# Patient Record
Sex: Male | Born: 1970 | Race: White | Hispanic: No | Marital: Married | Smoking: Never smoker
Health system: Southern US, Community
[De-identification: ages and names within clinical notes are randomized; demographics above are authoritative.]

## PROBLEM LIST (undated history)

## (undated) DIAGNOSIS — R0600 Dyspnea, unspecified: Secondary | ICD-10-CM

## (undated) DIAGNOSIS — F32A Depression, unspecified: Secondary | ICD-10-CM

## (undated) DIAGNOSIS — M2550 Pain in unspecified joint: Secondary | ICD-10-CM

## (undated) DIAGNOSIS — E291 Testicular hypofunction: Secondary | ICD-10-CM

## (undated) HISTORY — DX: Pain in unspecified joint: M25.50

## (undated) HISTORY — DX: Depression, unspecified: F32.A

## (undated) HISTORY — DX: Testicular hypofunction: E29.1

## (undated) HISTORY — DX: Dyspnea, unspecified: R06.00

---

## 2007-02-27 HISTORY — PX: VASECTOMY: SHX75

## 2011-02-27 DIAGNOSIS — B2790 Infectious mononucleosis, unspecified without complication: Secondary | ICD-10-CM

## 2011-02-27 DIAGNOSIS — B029 Zoster without complications: Secondary | ICD-10-CM

## 2011-02-27 HISTORY — DX: Zoster without complications: B02.9

## 2011-02-27 HISTORY — DX: Infectious mononucleosis, unspecified without complication: B27.90

## 2011-07-16 ENCOUNTER — Other Ambulatory Visit (HOSPITAL_COMMUNITY): Payer: Self-pay | Admitting: Endocrinology

## 2011-07-16 ENCOUNTER — Other Ambulatory Visit: Payer: Self-pay | Admitting: Endocrinology

## 2011-07-16 DIAGNOSIS — E236 Other disorders of pituitary gland: Secondary | ICD-10-CM

## 2011-07-16 DIAGNOSIS — E291 Testicular hypofunction: Secondary | ICD-10-CM

## 2011-07-18 ENCOUNTER — Ambulatory Visit (HOSPITAL_COMMUNITY)
Admission: RE | Admit: 2011-07-18 | Discharge: 2011-07-18 | Disposition: A | Discharge status: Home / Self Care | Payer: BC Managed Care – PPO | Source: Ambulatory Visit | Attending: Endocrinology | Admitting: Endocrinology

## 2011-07-18 ENCOUNTER — Other Ambulatory Visit (HOSPITAL_COMMUNITY): Payer: Self-pay | Admitting: Endocrinology

## 2011-07-18 DIAGNOSIS — E291 Testicular hypofunction: Secondary | ICD-10-CM | POA: Insufficient documentation

## 2011-07-18 DIAGNOSIS — R51 Headache: Secondary | ICD-10-CM | POA: Insufficient documentation

## 2011-07-18 DIAGNOSIS — E236 Other disorders of pituitary gland: Secondary | ICD-10-CM

## 2011-07-18 DIAGNOSIS — Z1389 Encounter for screening for other disorder: Secondary | ICD-10-CM | POA: Insufficient documentation

## 2011-07-18 DIAGNOSIS — R5381 Other malaise: Secondary | ICD-10-CM | POA: Insufficient documentation

## 2011-07-18 MED ORDER — GADOBENATE DIMEGLUMINE 529 MG/ML IV SOLN
15.0000 mL | Freq: Once | INTRAVENOUS | Status: AC | PRN
  Administered 2011-07-18: 15 mL via INTRAVENOUS

## 2013-08-11 ENCOUNTER — Other Ambulatory Visit: Payer: Self-pay | Admitting: Neurology

## 2013-08-11 NOTE — Telephone Encounter (Signed)
Patient hasn't been seen since 2013

## 2015-06-15 ENCOUNTER — Other Ambulatory Visit: Payer: Self-pay | Admitting: Endocrinology

## 2015-06-15 DIAGNOSIS — E230 Hypopituitarism: Secondary | ICD-10-CM

## 2015-06-21 ENCOUNTER — Other Ambulatory Visit: Payer: Self-pay | Admitting: Endocrinology

## 2015-06-21 DIAGNOSIS — E230 Hypopituitarism: Secondary | ICD-10-CM

## 2015-06-28 ENCOUNTER — Other Ambulatory Visit: Payer: Self-pay

## 2015-07-12 ENCOUNTER — Ambulatory Visit
Admission: RE | Admit: 2015-07-12 | Discharge: 2015-07-12 | Disposition: A | Discharge status: Home / Self Care | Payer: BLUE CROSS/BLUE SHIELD | Source: Ambulatory Visit | Attending: Endocrinology | Admitting: Endocrinology

## 2015-07-12 DIAGNOSIS — E230 Hypopituitarism: Secondary | ICD-10-CM

## 2015-07-12 MED ORDER — GADOBENATE DIMEGLUMINE 529 MG/ML IV SOLN: 10.0000 mL | Freq: Once | INTRAVENOUS | Status: DC | PRN

## 2016-09-23 IMAGING — MR MR HEAD WO/W CM
19 series · 48 of 48 positions shown · IV contrast (Multihance 10CC)
Comparison: MRI brain [HOSPITAL], 07/18/2011.

CLINICAL DATA: Endocrine disorder. Low testosterone. Family history
of pituitary tumor.

EXAM:
MRI HEAD WITHOUT AND WITH CONTRAST
TECHNIQUE: Multiplanar, multiecho pulse sequences of the brain and surrounding
structures were obtained without and with intravenous contrast.
CONTRAST:  MultiHance 10 mL.

[Series 9: DWI · axial · 3.0mm · 1.56mm/px · z∈[-68,+92]mm · 7 of 84 slices shown (1 of 2)]
[im 1/84]
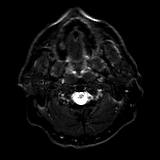
[im 14/84]
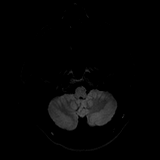
[im 28/84]
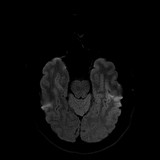
[im 42/84]
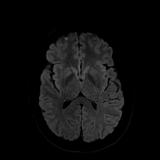
[im 56/84]
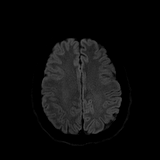
[im 70/84]
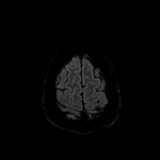
[im 84/84]
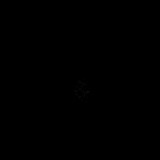

[Series 10: DWI · axial · 3.0mm · 1.56mm/px · z∈[-68,+92]mm · 3 of 41 slices shown (2 of 2)]
[im 1/41]
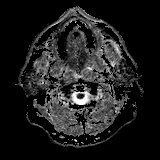
[im 21/41]
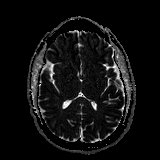
[im 41/41]
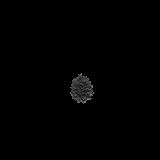

[Series 11: T1 · sagittal · 4.0mm · 0.78mm/px · 2 of 30 slices shown (1 of 5)]
[im 1/30]
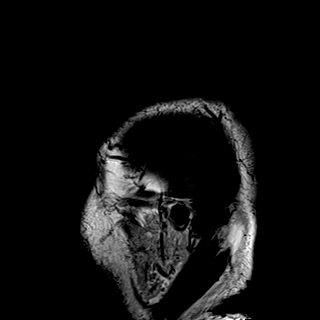
[im 30/30]
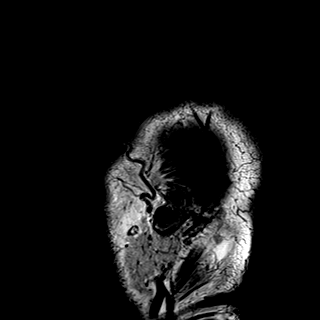

[Series 12: T2 · axial · 4.0mm · 0.39mm/px · z∈[-56,+85]mm · 2 of 28 slices shown]
[im 1/28]
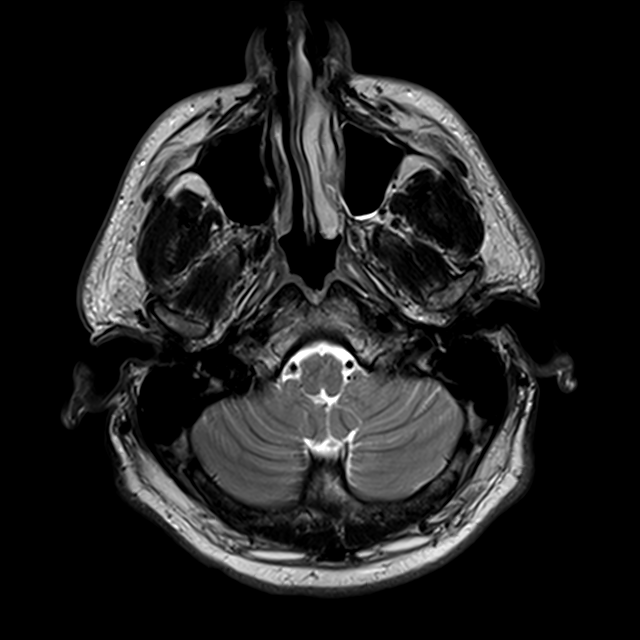
[im 28/28]
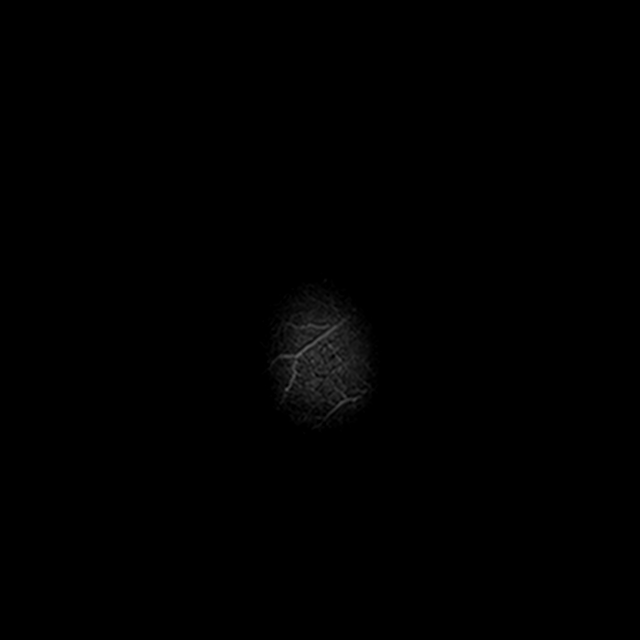

[Series 13: GRE · axial · 4.0mm · 0.78mm/px · z∈[-56,+85]mm · 2 of 28 slices shown]
[im 1/28]
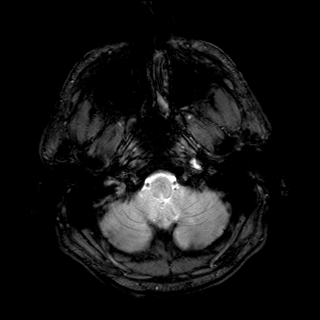
[im 28/28]
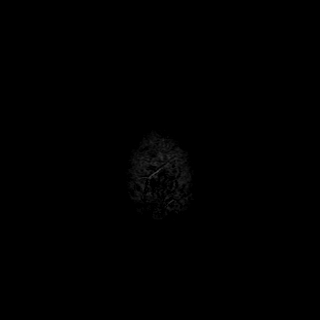

[Series 14: FLAIR · axial · 4.0mm · 0.78mm/px · z∈[-55,+85]mm · 3 of 28 slices shown]
[im 1/28]
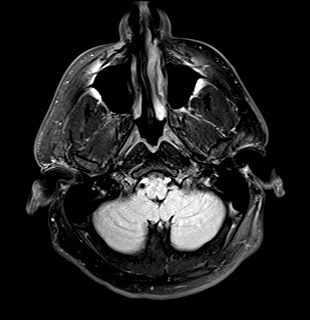
[im 14/28]
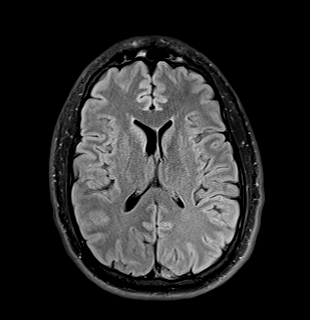
[im 28/28]
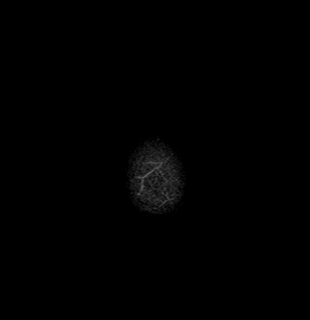

[Series 15: T1 · coronal · 3.0mm · 0.42mm/px · 1 of 10 slices shown (2 of 5)]
[im 1/10]
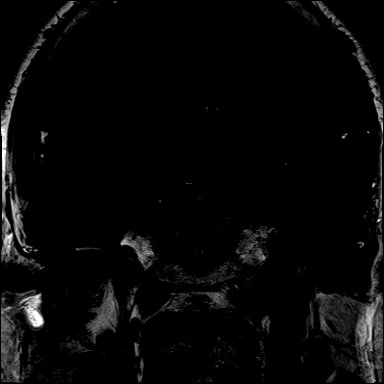

[Series 16: T1 · sagittal · 3.0mm · 0.42mm/px · 1 of 13 slices shown (3 of 5)]
[im 1/13]
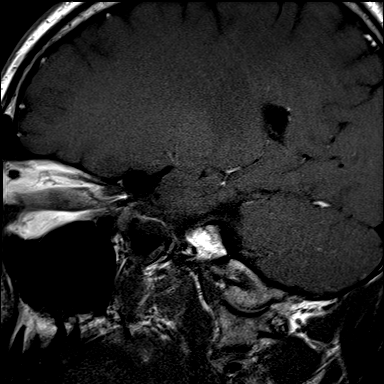

[Series 17: T1 · coronal · non-contrast · 3.0mm · 0.62mm/px · 1 of 7 slices shown (4 of 5)]
[im 1/7]
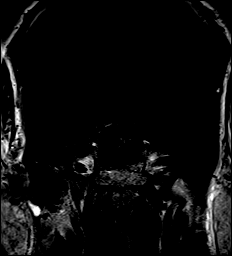

[Series 18: cor post dyn · coronal · 3.0mm · 0.62mm/px · 1 of 7 slices shown (1 of 6)]
[im 1/7]
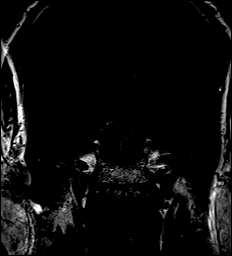

[Series 19: cor post dyn · coronal · 3.0mm · 0.62mm/px · 1 of 7 slices shown (2 of 6)]
[im 1/7]
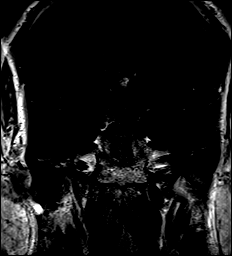

[Series 20: cor post dyn · coronal · 3.0mm · 0.62mm/px · 1 of 7 slices shown (3 of 6)]
[im 1/7]
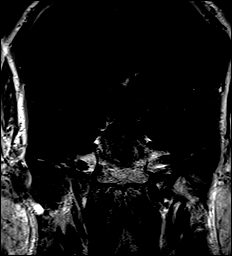

[Series 21: cor post dyn · coronal · 3.0mm · 0.62mm/px · 1 of 7 slices shown (4 of 6)]
[im 1/7]
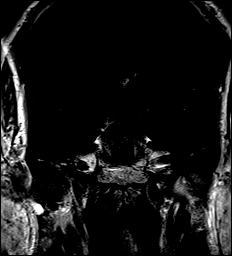

[Series 22: cor post dyn · coronal · 3.0mm · 0.62mm/px · 1 of 7 slices shown (5 of 6)]
[im 1/7]
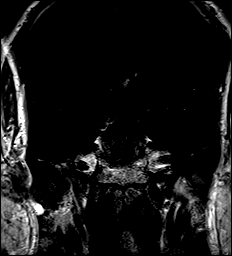

[Series 23: cor post dyn · coronal · 3.0mm · 0.62mm/px · 1 of 7 slices shown (6 of 6)]
[im 1/7]
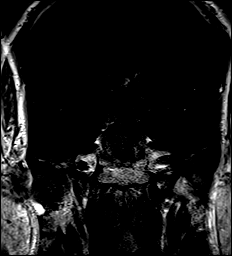

[Series 24: T1 post-contrast · sagittal · 3.0mm · 0.42mm/px · 1 of 13 slices shown (1 of 3)]
[im 1/13]
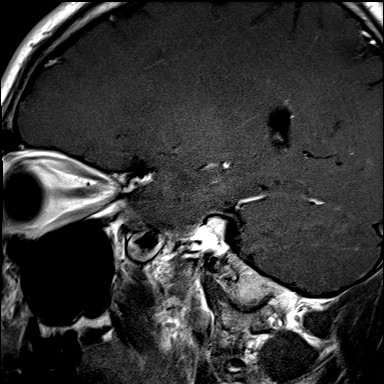

[Series 25: T1 post-contrast · coronal · 3.0mm · 0.42mm/px · 1 of 10 slices shown (2 of 3)]
[im 1/10]
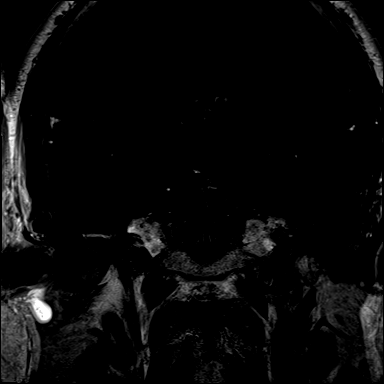

[Series 26: T1 · axial · 1.0mm · 0.98mm/px · z∈[-73,+97]mm · 15 of 169 slices shown (5 of 5)]
[im 1/169]
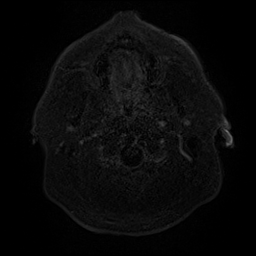
[im 13/169]
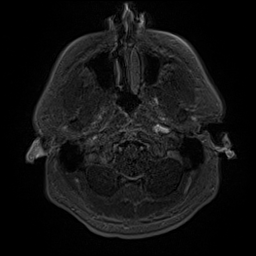
[im 25/169]
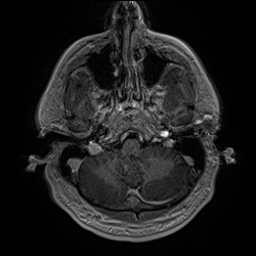
[im 37/169]
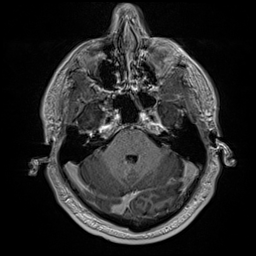
[im 49/169]
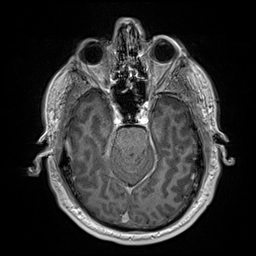
[im 61/169]
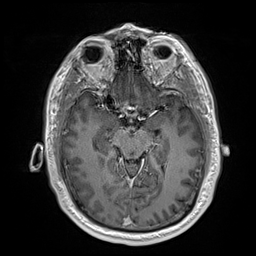
[im 73/169]
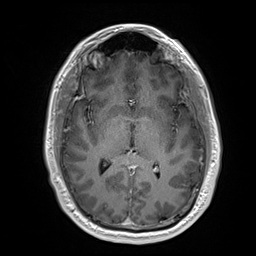
[im 85/169]
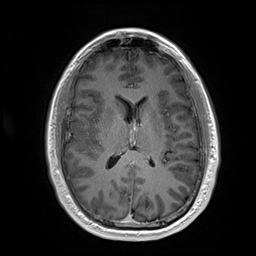
[im 97/169]
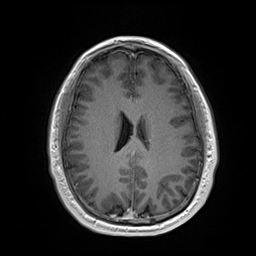
[im 109/169]
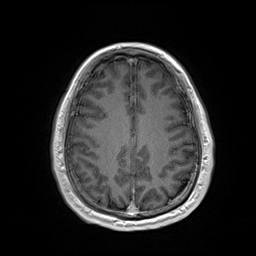
[im 121/169]
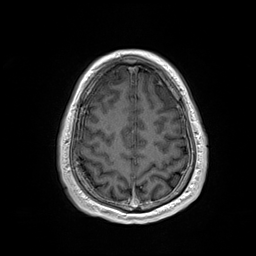
[im 133/169]
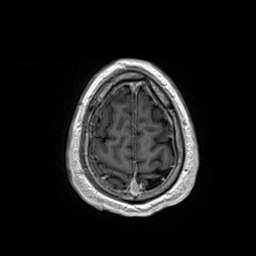
[im 145/169]
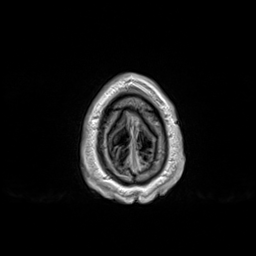
[im 157/169]
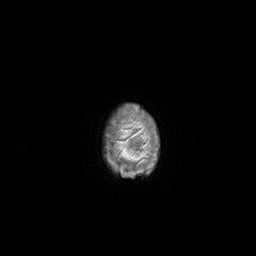
[im 169/169]
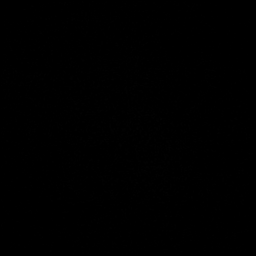

[Series 27: T1 post-contrast · coronal · 4.0mm · 0.47mm/px · 3 of 30 slices shown (3 of 3)]
[im 1/30]
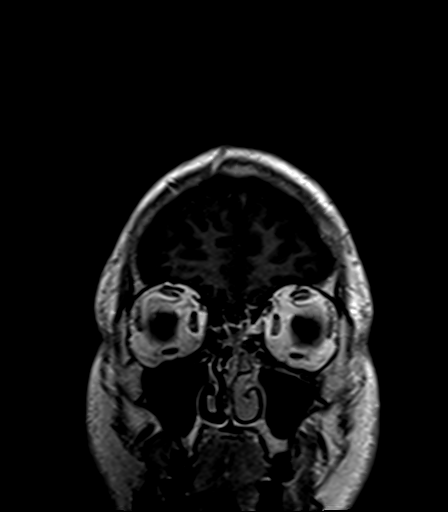
[im 15/30]
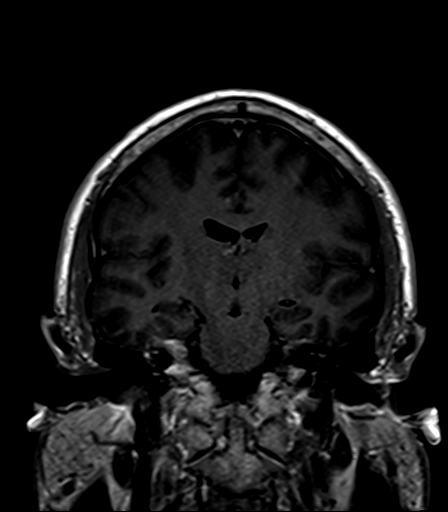
[im 30/30]
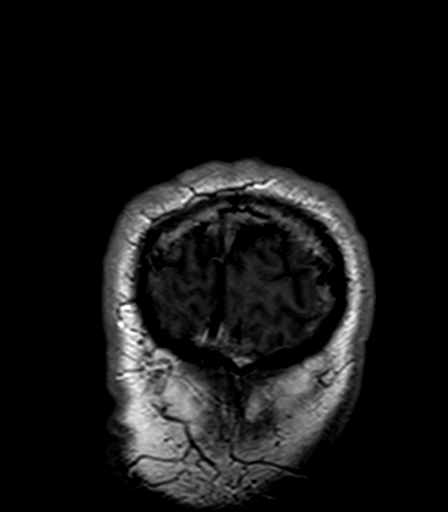

[48 of 48 positions shown; findings below may reference images not displayed]

FINDINGS: No evidence for acute infarction, hemorrhage, mass lesion,
hydrocephalus, or extra-axial fluid. Normal cerebral volume. No
white matter disease.

Post infusion, no abnormal enhancement.

Dynamic imaging through the pituitary was performed. No focal
lesions are seen in the pituitary, but the gland height appears
diminished from priors. Previously, on the 1382 scan, gland height
was 4 mm measured at the stalk. On today's study, gland height is
2-3 mm. Significance is unclear. No cavernous sinus or suprasellar
lesion. No hypothalamic abnormality. Extracranial soft tissues are
unremarkable.
IMPRESSION: No acute intracranial findings.

Diminished height and volume of the pituitary compared with 1382
without intrinsic microadenoma or parasellar, cavernous sinus, or
hypothalamic lesion. Significance uncertain.

## 2016-11-29 DIAGNOSIS — F429 Obsessive-compulsive disorder, unspecified: Secondary | ICD-10-CM | POA: Diagnosis not present

## 2016-12-04 DIAGNOSIS — F429 Obsessive-compulsive disorder, unspecified: Secondary | ICD-10-CM | POA: Diagnosis not present

## 2016-12-11 DIAGNOSIS — M722 Plantar fascial fibromatosis: Secondary | ICD-10-CM | POA: Diagnosis not present

## 2016-12-23 DIAGNOSIS — J0100 Acute maxillary sinusitis, unspecified: Secondary | ICD-10-CM | POA: Diagnosis not present

## 2017-01-02 DIAGNOSIS — F429 Obsessive-compulsive disorder, unspecified: Secondary | ICD-10-CM | POA: Diagnosis not present

## 2017-01-08 DIAGNOSIS — F428 Other obsessive-compulsive disorder: Secondary | ICD-10-CM | POA: Diagnosis not present

## 2017-01-08 DIAGNOSIS — F429 Obsessive-compulsive disorder, unspecified: Secondary | ICD-10-CM | POA: Diagnosis not present

## 2017-01-08 DIAGNOSIS — Z79899 Other long term (current) drug therapy: Secondary | ICD-10-CM | POA: Diagnosis not present

## 2017-02-07 DIAGNOSIS — F428 Other obsessive-compulsive disorder: Secondary | ICD-10-CM | POA: Diagnosis not present

## 2017-02-26 DIAGNOSIS — E538 Deficiency of other specified B group vitamins: Secondary | ICD-10-CM

## 2017-02-26 HISTORY — DX: Deficiency of other specified B group vitamins: E53.8

## 2017-03-07 DIAGNOSIS — F428 Other obsessive-compulsive disorder: Secondary | ICD-10-CM | POA: Diagnosis not present

## 2017-03-26 DIAGNOSIS — E230 Hypopituitarism: Secondary | ICD-10-CM | POA: Diagnosis not present

## 2017-04-01 DIAGNOSIS — Z6836 Body mass index (BMI) 36.0-36.9, adult: Secondary | ICD-10-CM | POA: Diagnosis not present

## 2017-04-01 DIAGNOSIS — E291 Testicular hypofunction: Secondary | ICD-10-CM | POA: Diagnosis not present

## 2017-04-01 DIAGNOSIS — R5383 Other fatigue: Secondary | ICD-10-CM | POA: Diagnosis not present

## 2017-04-01 DIAGNOSIS — K117 Disturbances of salivary secretion: Secondary | ICD-10-CM | POA: Diagnosis not present

## 2017-04-09 DIAGNOSIS — E230 Hypopituitarism: Secondary | ICD-10-CM | POA: Diagnosis not present

## 2017-05-06 DIAGNOSIS — E538 Deficiency of other specified B group vitamins: Secondary | ICD-10-CM | POA: Diagnosis not present

## 2017-05-06 DIAGNOSIS — E230 Hypopituitarism: Secondary | ICD-10-CM | POA: Diagnosis not present

## 2017-05-08 DIAGNOSIS — E7849 Other hyperlipidemia: Secondary | ICD-10-CM | POA: Diagnosis not present

## 2017-05-08 DIAGNOSIS — D751 Secondary polycythemia: Secondary | ICD-10-CM | POA: Diagnosis not present

## 2017-05-08 DIAGNOSIS — F418 Other specified anxiety disorders: Secondary | ICD-10-CM | POA: Diagnosis not present

## 2017-05-08 DIAGNOSIS — Z1389 Encounter for screening for other disorder: Secondary | ICD-10-CM | POA: Diagnosis not present

## 2017-05-08 DIAGNOSIS — E538 Deficiency of other specified B group vitamins: Secondary | ICD-10-CM | POA: Diagnosis not present

## 2017-06-05 DIAGNOSIS — M9901 Segmental and somatic dysfunction of cervical region: Secondary | ICD-10-CM | POA: Diagnosis not present

## 2017-06-05 DIAGNOSIS — M9904 Segmental and somatic dysfunction of sacral region: Secondary | ICD-10-CM | POA: Diagnosis not present

## 2017-06-05 DIAGNOSIS — M9903 Segmental and somatic dysfunction of lumbar region: Secondary | ICD-10-CM | POA: Diagnosis not present

## 2017-09-05 DIAGNOSIS — E291 Testicular hypofunction: Secondary | ICD-10-CM | POA: Diagnosis not present

## 2017-09-05 DIAGNOSIS — F428 Other obsessive-compulsive disorder: Secondary | ICD-10-CM | POA: Diagnosis not present

## 2017-09-10 DIAGNOSIS — R198 Other specified symptoms and signs involving the digestive system and abdomen: Secondary | ICD-10-CM | POA: Diagnosis not present

## 2017-09-10 DIAGNOSIS — K529 Noninfective gastroenteritis and colitis, unspecified: Secondary | ICD-10-CM | POA: Diagnosis not present

## 2017-09-10 DIAGNOSIS — R140 Abdominal distension (gaseous): Secondary | ICD-10-CM | POA: Diagnosis not present

## 2017-09-10 DIAGNOSIS — K625 Hemorrhage of anus and rectum: Secondary | ICD-10-CM | POA: Diagnosis not present

## 2017-09-11 DIAGNOSIS — E7849 Other hyperlipidemia: Secondary | ICD-10-CM | POA: Diagnosis not present

## 2017-09-11 DIAGNOSIS — D751 Secondary polycythemia: Secondary | ICD-10-CM | POA: Diagnosis not present

## 2017-09-11 DIAGNOSIS — E538 Deficiency of other specified B group vitamins: Secondary | ICD-10-CM | POA: Diagnosis not present

## 2017-09-11 DIAGNOSIS — E230 Hypopituitarism: Secondary | ICD-10-CM | POA: Diagnosis not present

## 2017-10-03 DIAGNOSIS — F428 Other obsessive-compulsive disorder: Secondary | ICD-10-CM | POA: Diagnosis not present

## 2017-10-29 DIAGNOSIS — M9901 Segmental and somatic dysfunction of cervical region: Secondary | ICD-10-CM | POA: Diagnosis not present

## 2017-10-29 DIAGNOSIS — M9903 Segmental and somatic dysfunction of lumbar region: Secondary | ICD-10-CM | POA: Diagnosis not present

## 2017-10-29 DIAGNOSIS — M9904 Segmental and somatic dysfunction of sacral region: Secondary | ICD-10-CM | POA: Diagnosis not present

## 2017-11-20 DIAGNOSIS — F428 Other obsessive-compulsive disorder: Secondary | ICD-10-CM | POA: Diagnosis not present

## 2017-11-26 DIAGNOSIS — M9903 Segmental and somatic dysfunction of lumbar region: Secondary | ICD-10-CM | POA: Diagnosis not present

## 2017-11-26 DIAGNOSIS — M9904 Segmental and somatic dysfunction of sacral region: Secondary | ICD-10-CM | POA: Diagnosis not present

## 2017-11-26 DIAGNOSIS — M9901 Segmental and somatic dysfunction of cervical region: Secondary | ICD-10-CM | POA: Diagnosis not present

## 2018-01-09 DIAGNOSIS — Z23 Encounter for immunization: Secondary | ICD-10-CM | POA: Diagnosis not present

## 2018-02-13 DIAGNOSIS — F422 Mixed obsessional thoughts and acts: Secondary | ICD-10-CM | POA: Diagnosis not present

## 2018-02-27 DIAGNOSIS — J019 Acute sinusitis, unspecified: Secondary | ICD-10-CM | POA: Diagnosis not present

## 2018-02-27 DIAGNOSIS — R0981 Nasal congestion: Secondary | ICD-10-CM | POA: Diagnosis not present

## 2018-02-27 DIAGNOSIS — H1033 Unspecified acute conjunctivitis, bilateral: Secondary | ICD-10-CM | POA: Diagnosis not present

## 2018-03-10 DIAGNOSIS — E291 Testicular hypofunction: Secondary | ICD-10-CM | POA: Diagnosis not present

## 2018-03-17 DIAGNOSIS — E7849 Other hyperlipidemia: Secondary | ICD-10-CM | POA: Diagnosis not present

## 2018-03-17 DIAGNOSIS — E230 Hypopituitarism: Secondary | ICD-10-CM | POA: Diagnosis not present

## 2018-03-17 DIAGNOSIS — Z1331 Encounter for screening for depression: Secondary | ICD-10-CM | POA: Diagnosis not present

## 2018-03-17 DIAGNOSIS — F418 Other specified anxiety disorders: Secondary | ICD-10-CM | POA: Diagnosis not present

## 2018-03-17 DIAGNOSIS — E538 Deficiency of other specified B group vitamins: Secondary | ICD-10-CM | POA: Diagnosis not present

## 2018-05-06 DIAGNOSIS — F422 Mixed obsessional thoughts and acts: Secondary | ICD-10-CM | POA: Diagnosis not present

## 2018-07-02 DIAGNOSIS — Z79899 Other long term (current) drug therapy: Secondary | ICD-10-CM | POA: Diagnosis not present

## 2018-07-02 DIAGNOSIS — Z6836 Body mass index (BMI) 36.0-36.9, adult: Secondary | ICD-10-CM | POA: Diagnosis not present

## 2018-07-02 DIAGNOSIS — E7800 Pure hypercholesterolemia, unspecified: Secondary | ICD-10-CM | POA: Diagnosis not present

## 2018-07-02 DIAGNOSIS — E669 Obesity, unspecified: Secondary | ICD-10-CM | POA: Diagnosis not present

## 2018-07-15 DIAGNOSIS — E6609 Other obesity due to excess calories: Secondary | ICD-10-CM | POA: Diagnosis not present

## 2018-07-15 DIAGNOSIS — Z6835 Body mass index (BMI) 35.0-35.9, adult: Secondary | ICD-10-CM | POA: Diagnosis not present

## 2018-07-15 DIAGNOSIS — E669 Obesity, unspecified: Secondary | ICD-10-CM | POA: Diagnosis not present

## 2018-07-29 DIAGNOSIS — F422 Mixed obsessional thoughts and acts: Secondary | ICD-10-CM | POA: Diagnosis not present

## 2018-08-05 DIAGNOSIS — R030 Elevated blood-pressure reading, without diagnosis of hypertension: Secondary | ICD-10-CM | POA: Diagnosis not present

## 2018-08-05 DIAGNOSIS — G4700 Insomnia, unspecified: Secondary | ICD-10-CM | POA: Diagnosis not present

## 2018-08-05 DIAGNOSIS — N183 Chronic kidney disease, stage 3 (moderate): Secondary | ICD-10-CM | POA: Diagnosis not present

## 2018-08-05 DIAGNOSIS — Z1159 Encounter for screening for other viral diseases: Secondary | ICD-10-CM | POA: Diagnosis not present

## 2018-08-05 DIAGNOSIS — E669 Obesity, unspecified: Secondary | ICD-10-CM | POA: Diagnosis not present

## 2018-08-26 DIAGNOSIS — Z6835 Body mass index (BMI) 35.0-35.9, adult: Secondary | ICD-10-CM | POA: Diagnosis not present

## 2018-08-26 DIAGNOSIS — E6609 Other obesity due to excess calories: Secondary | ICD-10-CM | POA: Diagnosis not present

## 2018-08-26 DIAGNOSIS — E669 Obesity, unspecified: Secondary | ICD-10-CM | POA: Diagnosis not present

## 2018-09-05 DIAGNOSIS — N183 Chronic kidney disease, stage 3 (moderate): Secondary | ICD-10-CM | POA: Diagnosis not present

## 2018-09-23 DIAGNOSIS — E669 Obesity, unspecified: Secondary | ICD-10-CM | POA: Diagnosis not present

## 2018-09-26 DIAGNOSIS — M9904 Segmental and somatic dysfunction of sacral region: Secondary | ICD-10-CM | POA: Diagnosis not present

## 2018-09-26 DIAGNOSIS — M9903 Segmental and somatic dysfunction of lumbar region: Secondary | ICD-10-CM | POA: Diagnosis not present

## 2018-09-26 DIAGNOSIS — M9901 Segmental and somatic dysfunction of cervical region: Secondary | ICD-10-CM | POA: Diagnosis not present

## 2018-10-24 DIAGNOSIS — M9903 Segmental and somatic dysfunction of lumbar region: Secondary | ICD-10-CM | POA: Diagnosis not present

## 2018-10-24 DIAGNOSIS — M9904 Segmental and somatic dysfunction of sacral region: Secondary | ICD-10-CM | POA: Diagnosis not present

## 2018-10-24 DIAGNOSIS — M9901 Segmental and somatic dysfunction of cervical region: Secondary | ICD-10-CM | POA: Diagnosis not present

## 2018-10-28 DIAGNOSIS — F422 Mixed obsessional thoughts and acts: Secondary | ICD-10-CM | POA: Diagnosis not present

## 2018-11-06 DIAGNOSIS — E291 Testicular hypofunction: Secondary | ICD-10-CM | POA: Diagnosis not present

## 2018-11-10 DIAGNOSIS — D751 Secondary polycythemia: Secondary | ICD-10-CM | POA: Diagnosis not present

## 2018-11-10 DIAGNOSIS — E230 Hypopituitarism: Secondary | ICD-10-CM | POA: Diagnosis not present

## 2018-11-10 DIAGNOSIS — E538 Deficiency of other specified B group vitamins: Secondary | ICD-10-CM | POA: Diagnosis not present

## 2018-11-10 DIAGNOSIS — E785 Hyperlipidemia, unspecified: Secondary | ICD-10-CM | POA: Diagnosis not present

## 2018-11-10 DIAGNOSIS — Z1331 Encounter for screening for depression: Secondary | ICD-10-CM | POA: Diagnosis not present

## 2019-01-20 DIAGNOSIS — F422 Mixed obsessional thoughts and acts: Secondary | ICD-10-CM | POA: Diagnosis not present

## 2019-01-20 DIAGNOSIS — Z79899 Other long term (current) drug therapy: Secondary | ICD-10-CM | POA: Diagnosis not present

## 2019-02-18 DIAGNOSIS — M9903 Segmental and somatic dysfunction of lumbar region: Secondary | ICD-10-CM | POA: Diagnosis not present

## 2019-02-18 DIAGNOSIS — M9904 Segmental and somatic dysfunction of sacral region: Secondary | ICD-10-CM | POA: Diagnosis not present

## 2019-02-18 DIAGNOSIS — M9901 Segmental and somatic dysfunction of cervical region: Secondary | ICD-10-CM | POA: Diagnosis not present

## 2019-03-04 DIAGNOSIS — M542 Cervicalgia: Secondary | ICD-10-CM | POA: Diagnosis not present

## 2019-03-04 DIAGNOSIS — M6281 Muscle weakness (generalized): Secondary | ICD-10-CM | POA: Diagnosis not present

## 2019-03-11 DIAGNOSIS — M25611 Stiffness of right shoulder, not elsewhere classified: Secondary | ICD-10-CM | POA: Diagnosis not present

## 2019-03-11 DIAGNOSIS — M6281 Muscle weakness (generalized): Secondary | ICD-10-CM | POA: Diagnosis not present

## 2019-03-11 DIAGNOSIS — M25511 Pain in right shoulder: Secondary | ICD-10-CM | POA: Diagnosis not present

## 2019-03-18 DIAGNOSIS — M25511 Pain in right shoulder: Secondary | ICD-10-CM | POA: Diagnosis not present

## 2019-03-18 DIAGNOSIS — M25611 Stiffness of right shoulder, not elsewhere classified: Secondary | ICD-10-CM | POA: Diagnosis not present

## 2019-03-18 DIAGNOSIS — M6281 Muscle weakness (generalized): Secondary | ICD-10-CM | POA: Diagnosis not present

## 2019-03-25 DIAGNOSIS — M25611 Stiffness of right shoulder, not elsewhere classified: Secondary | ICD-10-CM | POA: Diagnosis not present

## 2019-03-25 DIAGNOSIS — M6281 Muscle weakness (generalized): Secondary | ICD-10-CM | POA: Diagnosis not present

## 2019-03-25 DIAGNOSIS — M25511 Pain in right shoulder: Secondary | ICD-10-CM | POA: Diagnosis not present

## 2019-04-06 DIAGNOSIS — M25611 Stiffness of right shoulder, not elsewhere classified: Secondary | ICD-10-CM | POA: Diagnosis not present

## 2019-04-06 DIAGNOSIS — M25511 Pain in right shoulder: Secondary | ICD-10-CM | POA: Diagnosis not present

## 2019-04-06 DIAGNOSIS — M6281 Muscle weakness (generalized): Secondary | ICD-10-CM | POA: Diagnosis not present

## 2019-04-20 DIAGNOSIS — M6281 Muscle weakness (generalized): Secondary | ICD-10-CM | POA: Diagnosis not present

## 2019-04-20 DIAGNOSIS — M25611 Stiffness of right shoulder, not elsewhere classified: Secondary | ICD-10-CM | POA: Diagnosis not present

## 2019-04-20 DIAGNOSIS — M25511 Pain in right shoulder: Secondary | ICD-10-CM | POA: Diagnosis not present

## 2019-05-26 DIAGNOSIS — F4312 Post-traumatic stress disorder, chronic: Secondary | ICD-10-CM | POA: Diagnosis not present

## 2019-05-26 DIAGNOSIS — F422 Mixed obsessional thoughts and acts: Secondary | ICD-10-CM | POA: Diagnosis not present

## 2019-05-26 DIAGNOSIS — Z79899 Other long term (current) drug therapy: Secondary | ICD-10-CM | POA: Diagnosis not present

## 2019-06-30 DIAGNOSIS — Z1152 Encounter for screening for COVID-19: Secondary | ICD-10-CM | POA: Diagnosis not present

## 2019-06-30 DIAGNOSIS — Z20822 Contact with and (suspected) exposure to covid-19: Secondary | ICD-10-CM | POA: Diagnosis not present

## 2019-07-16 DIAGNOSIS — Z125 Encounter for screening for malignant neoplasm of prostate: Secondary | ICD-10-CM | POA: Diagnosis not present

## 2019-07-16 DIAGNOSIS — E291 Testicular hypofunction: Secondary | ICD-10-CM | POA: Diagnosis not present

## 2019-07-16 DIAGNOSIS — D751 Secondary polycythemia: Secondary | ICD-10-CM | POA: Diagnosis not present

## 2019-07-21 DIAGNOSIS — E230 Hypopituitarism: Secondary | ICD-10-CM | POA: Diagnosis not present

## 2019-07-21 DIAGNOSIS — F422 Mixed obsessional thoughts and acts: Secondary | ICD-10-CM | POA: Diagnosis not present

## 2019-07-21 DIAGNOSIS — Z79899 Other long term (current) drug therapy: Secondary | ICD-10-CM | POA: Diagnosis not present

## 2019-07-21 DIAGNOSIS — E7849 Other hyperlipidemia: Secondary | ICD-10-CM | POA: Diagnosis not present

## 2019-07-21 DIAGNOSIS — Z1389 Encounter for screening for other disorder: Secondary | ICD-10-CM | POA: Diagnosis not present

## 2019-07-21 DIAGNOSIS — E538 Deficiency of other specified B group vitamins: Secondary | ICD-10-CM | POA: Diagnosis not present

## 2019-07-21 DIAGNOSIS — R5383 Other fatigue: Secondary | ICD-10-CM | POA: Diagnosis not present

## 2019-07-22 DIAGNOSIS — M9904 Segmental and somatic dysfunction of sacral region: Secondary | ICD-10-CM | POA: Diagnosis not present

## 2019-07-22 DIAGNOSIS — M9903 Segmental and somatic dysfunction of lumbar region: Secondary | ICD-10-CM | POA: Diagnosis not present

## 2019-07-22 DIAGNOSIS — M9901 Segmental and somatic dysfunction of cervical region: Secondary | ICD-10-CM | POA: Diagnosis not present

## 2019-08-13 DIAGNOSIS — R5383 Other fatigue: Secondary | ICD-10-CM | POA: Diagnosis not present

## 2019-09-01 DIAGNOSIS — F4312 Post-traumatic stress disorder, chronic: Secondary | ICD-10-CM | POA: Diagnosis not present

## 2019-09-01 DIAGNOSIS — F900 Attention-deficit hyperactivity disorder, predominantly inattentive type: Secondary | ICD-10-CM | POA: Diagnosis not present

## 2019-09-01 DIAGNOSIS — F422 Mixed obsessional thoughts and acts: Secondary | ICD-10-CM | POA: Diagnosis not present

## 2019-09-01 DIAGNOSIS — Z79899 Other long term (current) drug therapy: Secondary | ICD-10-CM | POA: Diagnosis not present

## 2019-09-23 ENCOUNTER — Encounter: Payer: Self-pay | Admitting: Neurology

## 2019-09-24 ENCOUNTER — Ambulatory Visit: Payer: BC Managed Care – PPO | Admitting: Neurology

## 2019-09-24 ENCOUNTER — Encounter: Payer: Self-pay | Admitting: Neurology

## 2019-09-24 ENCOUNTER — Other Ambulatory Visit: Payer: Self-pay

## 2019-09-24 VITALS — BP 133/79 | HR 84 | Ht 69.0 in | Wt 241.0 lb

## 2019-09-24 DIAGNOSIS — G478 Other sleep disorders: Secondary | ICD-10-CM | POA: Diagnosis not present

## 2019-09-24 DIAGNOSIS — F518 Other sleep disorders not due to a substance or known physiological condition: Secondary | ICD-10-CM

## 2019-09-24 DIAGNOSIS — G4719 Other hypersomnia: Secondary | ICD-10-CM | POA: Diagnosis not present

## 2019-09-24 DIAGNOSIS — B2790 Infectious mononucleosis, unspecified without complication: Secondary | ICD-10-CM | POA: Diagnosis not present

## 2019-09-24 DIAGNOSIS — R5383 Other fatigue: Secondary | ICD-10-CM | POA: Diagnosis not present
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
## 2019-09-24 NOTE — Patient Instructions (Signed)
Narcolepsy Narcolepsy is a neurological disorder that causes people to fall asleep suddenly and without control (have sleep attacks) during the daytime. It is a lifelong disorder. Narcolepsy disrupts the sleep cycle at night, which then causes daytime sleepiness. What are the causes? The cause of narcolepsy is not fully understood, but it may be related to:  Low levels of hypocretin, a chemical (neurotransmitter) in the brain that controls sleep and wake cycles. Hypocretin imbalance may be caused by: ? Abnormal genes that are passed from parent to child (inherited). ? An autoimmune disease in which the body's defense system (immune system) attacks the brain cells that make hypocretin.  Infection, tumor, or injury in the area of the brain that controls sleep.  Exposure to poisons (toxins), such as heavy metals, pesticides, and secondhand smoke. What are the signs or symptoms? Symptoms of this condition include:  Excessive daytime sleepiness. This is the most common symptom and is usually the first symptom you will notice. This may affect your performance at work or school.  Sleep attacks. You may fall asleep in the middle of an activity, especially low-energy activities like reading or watching TV.  Feeling like you cannot think clearly and trouble focusing or remembering things. You may also feel depressed.  Sudden muscle weakness (cataplexy). When this occurs, your speech may become slurred, or your knees may buckle. Cataplexy is usually triggered by surprise, anger, fear, or laughter.  Losing the ability to speak or move (sleep paralysis). This may occur just as you start to fall asleep or wake up. You will be aware of the paralysis. It usually lasts for just a few seconds or minutes.  Seeing, hearing, tasting, smelling, or feeling things that are not real (hallucinations). Hallucinations may occur with sleep paralysis. They can happen when you are falling asleep, waking up, or  dozing.  Trouble staying asleep at night (insomnia) and restless sleep. How is this diagnosed? This condition may be diagnosed based on:  A physical exam to rule out any other problems that may be causing your symptoms. You may be asked to write down your sleeping patterns for several weeks in a sleep diary. This will help your health care provider make a diagnosis.  Sleep studies that measure how well your REM sleep is regulated. These tests also measure your heart rate, breathing, movement, and brain waves. These tests include: ? An overnight sleep study (polysomnogram). ? A daytime sleep study that is done while you take several naps during the day (multiple sleep latency test, MSLT). This test measures how quickly you fall asleep and how quickly you enter REM sleep.  Removal of spinal fluid to measure hypocretin levels. How is this treated? There is no cure for this condition, but treatment can help relieve symptoms. Treatment may include:  Lifestyle and sleeping strategies to help you cope with the condition, such as: ? Exercising regularly. ? Maintaining a regular sleep schedule. ? Avoiding caffeine and large meals before bed.  Medicines. These may include: ? Medicines that help keep you awake and alert (stimulants) to fight daytime sleepiness. ? Medicines that treat depression (antidepressants). These may be used to treat cataplexy. ? Sodium oxybate. This is a strong medicine to help you relax (sedative) that you may take at night. It can help control daytime sleepiness and cataplexy.  Other treatments may include mental health counseling or joining a support group. Follow these instructions at home: Sleeping habits   Get about 8 hours of sleep every night.  Go to   sleep and get up at about the same time every day.  Keep your bedroom dark, quiet, and comfortable.  When you feel very tired, take short naps. Schedule naps so that you take them at about the same time every  day.  Before bedtime: ? Avoid bright lights and screens. ? Relax. Try activities like reading or taking a warm bath. Activity  Get at least 20 minutes of exercise every day. This will help you sleep better at night and reduce daytime sleepiness.  Avoid exercising within 3 hours of bedtime.  Do not drive or use heavy machinery if you are sleepy. If possible, take a nap before driving.  Do not swim or go out on the water without a life jacket. Eating and drinking  Do not drink alcohol or caffeinated beverages within 4-5 hours of bedtime.  Do not eat a large meal before bedtime.  Eat meals at about the same times every day. General instructions   Take over-the-counter and prescription medicines only as told by your health care provider.  Keep a sleep diary as told by your health care provider.  Tell your employer or teachers that you have narcolepsy. You may be able to adjust your schedule to include time for naps.  Do not use any products that contain nicotine or tobacco, such as cigarettes, e-cigarettes, and chewing tobacco. If you need help quitting, ask your health care provider.  Keep all follow-up visits as told by your health care provider. This is important. Where to find more information  National Institute of Neurological Disorders: www.ninds.nih.gov Contact a health care provider if:  Your symptoms are not getting better.  You have increasingly high blood pressure (hypertension).  You have changes in your heart rhythm.  You are having a hard time determining what is real and what is not (psychosis). Get help right away if you:  Hurt yourself during a sleep attack or an attack of cataplexy.  Have chest pain.  Have trouble breathing. These symptoms may represent a serious problem that is an emergency. Do not wait to see if the symptoms will go away. Get medical help right away. Call your local emergency services (911 in the [place] Do not drive yourself to the  hospital. Summary  Narcolepsy is a neurological disorder that causes people to fall asleep suddenly, and without control, during the daytime (sleep attacks). It is a lifelong disorder.  There is no cure for this condition, but treatment can help relieve symptoms.  Go to sleep and get up at about the same time every day. Follow instructions about sleep and activities as told by your health care provider.  Take over-the-counter and prescription medicines only as told by your health care provider. This information is not intended to replace advice given to you by your health care provider. Make sure you discuss any questions you have with your health care provider. Document Revised: 09/24/2018 Document Reviewed: 09/24/2018 Elsevier Patient Education  2020 [employer]  

## 2019-09-30 ENCOUNTER — Encounter: Payer: Self-pay | Admitting: Neurology

## 2019-09-30 LAB — NARCOLEPSY EVALUATION
DQA1*01:02: NEGATIVE
DQB1*06:02: NEGATIVE

## 2019-09-30 NOTE — Progress Notes (Signed)
Negative for narcolepsy alleles.

## 2019-10-27 ENCOUNTER — Ambulatory Visit (INDEPENDENT_AMBULATORY_CARE_PROVIDER_SITE_OTHER): Payer: BC Managed Care – PPO | Admitting: Neurology

## 2019-10-27 DIAGNOSIS — G4719 Other hypersomnia: Secondary | ICD-10-CM

## 2019-10-27 DIAGNOSIS — F518 Other sleep disorders not due to a substance or known physiological condition: Secondary | ICD-10-CM

## 2019-10-27 DIAGNOSIS — G478 Other sleep disorders: Secondary | ICD-10-CM

## 2019-10-27 DIAGNOSIS — B2790 Infectious mononucleosis, unspecified without complication: Secondary | ICD-10-CM

## 2019-10-27 DIAGNOSIS — G4710 Hypersomnia, unspecified: Secondary | ICD-10-CM

## 2019-11-03 DIAGNOSIS — K625 Hemorrhage of anus and rectum: Secondary | ICD-10-CM | POA: Diagnosis not present

## 2019-11-03 DIAGNOSIS — K648 Other hemorrhoids: Secondary | ICD-10-CM | POA: Diagnosis not present

## 2019-11-03 DIAGNOSIS — R198 Other specified symptoms and signs involving the digestive system and abdomen: Secondary | ICD-10-CM | POA: Diagnosis not present

## 2019-11-10 ENCOUNTER — Encounter: Payer: Self-pay | Admitting: Neurology

## 2019-11-10 DIAGNOSIS — F518 Other sleep disorders not due to a substance or known physiological condition: Secondary | ICD-10-CM | POA: Insufficient documentation

## 2019-11-10 DIAGNOSIS — G478 Other sleep disorders: Secondary | ICD-10-CM | POA: Insufficient documentation

## 2019-11-10 DIAGNOSIS — B2790 Infectious mononucleosis, unspecified without complication: Secondary | ICD-10-CM | POA: Insufficient documentation

## 2019-11-10 DIAGNOSIS — G4719 Other hypersomnia: Secondary | ICD-10-CM | POA: Insufficient documentation

## 2019-11-10 NOTE — Addendum Note (Signed)
Addended by: Melvyn Novas on: 11/10/2019 05:13 PM   Modules accepted: Orders
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
## 2019-11-11 ENCOUNTER — Telehealth: Payer: Self-pay | Admitting: Neurology

## 2019-11-11 ENCOUNTER — Encounter: Payer: Self-pay | Admitting: Neurology

## 2019-11-11 NOTE — Telephone Encounter (Signed)
I called pt. I advised pt that Dr. Vickey Huger reviewed their sleep study results and found that pt has sleep apnea. Dr. Vickey Huger recommends that pt starts auto CPAP. I reviewed PAP compliance expectations with the pt. Pt is agreeable to starting a CPAP. I advised pt that an order will be sent to a DME, [facility] and [facility] will call the pt within about one week after they file with the pt's insurance. [facility]) will show the pt how to use the machine, fit for masks, and troubleshoot the CPAP if needed. A follow up appt was made for insurance purposes with Butch Penny, NP on Dec 16,2021 at 11:30 am. Pt verbalized understanding to arrive 15 minutes early and bring their CPAP. A letter with all of this information in it will be mailed to the pt as a reminder. I verified with the pt that the address we have on file is correct. Pt verbalized understanding of results. Pt had no questions at this time but was encouraged to call back if questions arise. I have sent the order to [facility]) and have received confirmation that they have received the order.

## 2019-11-11 NOTE — Telephone Encounter (Signed)
-----   Message from Melvyn Novas, MD sent at 11/10/2019  5:13 PM EDT ----- Summary & Diagnosis:     AHI of this HST was 36.3/h and indicates severe sleep apnea. The  number of apneas and hypopneas is higher in NREM sleep, which is  unusual.  The NREM AHI was 37.1/h, the prone sleep AHi was 42.6/h and  higher than in supine.  No tachy-bradycardia or hypoxemia was noted.  Recommendations:     This is severe enough sleep apnea to warrant treatment, and since  neither REM sleep nor hypoxemia were present, this kind of apnea  can be treated with CPAP or Inspire device.  The CPAP approach is easier and immediately accessible- Auto CPAP  device to be set for 6-18 cm water, 2 cm EPR and mask of choice.  Heated humidification and mask of patient's choice to be fitted  in reclined position.   Interpreting Physician: Melvyn Novas, MD

## 2019-11-16 DIAGNOSIS — M9902 Segmental and somatic dysfunction of thoracic region: Secondary | ICD-10-CM | POA: Diagnosis not present

## 2019-11-16 DIAGNOSIS — M9901 Segmental and somatic dysfunction of cervical region: Secondary | ICD-10-CM | POA: Diagnosis not present

## 2019-11-27 DIAGNOSIS — F422 Mixed obsessional thoughts and acts: Secondary | ICD-10-CM | POA: Diagnosis not present

## 2019-11-27 DIAGNOSIS — Z79899 Other long term (current) drug therapy: Secondary | ICD-10-CM | POA: Diagnosis not present

## 2019-12-31 DIAGNOSIS — E291 Testicular hypofunction: Secondary | ICD-10-CM | POA: Diagnosis not present

## 2019-12-31 DIAGNOSIS — G4733 Obstructive sleep apnea (adult) (pediatric): Secondary | ICD-10-CM | POA: Diagnosis not present

## 2020-01-04 ENCOUNTER — Telehealth: Payer: Self-pay | Admitting: Neurology

## 2020-01-04 ENCOUNTER — Encounter: Payer: Self-pay | Admitting: Neurology

## 2020-01-04 DIAGNOSIS — G4719 Other hypersomnia: Secondary | ICD-10-CM

## 2020-01-04 NOTE — Addendum Note (Signed)
Addended by: Judi Cong on: 01/04/2020 03:38 PM   Modules accepted: Orders

## 2020-01-04 NOTE — Telephone Encounter (Signed)
Pt is calling to report feeling of being smothered, lack of air when CPAP is 1st put on.  Pt states it will ramp up to 17 or 18 in the night.  Pt is asking for a call to discuss these concerns.  Pt also states he is only able to fall asleep by taking a sleep aid.

## 2020-01-04 NOTE — Telephone Encounter (Signed)
Spoke the patient and he stated he has concerned with falling asleep on it. I have spoke with the Dr. Vickey Huger and she reviewed the patient's concern. She will increase minimum pressure to 9 cm water pressure and then maximum pressure 20 cm water pressure. No ramp time.

## 2020-01-30 DIAGNOSIS — G4733 Obstructive sleep apnea (adult) (pediatric): Secondary | ICD-10-CM | POA: Diagnosis not present

## 2020-02-03 DIAGNOSIS — E230 Hypopituitarism: Secondary | ICD-10-CM | POA: Diagnosis not present

## 2020-02-03 DIAGNOSIS — E538 Deficiency of other specified B group vitamins: Secondary | ICD-10-CM | POA: Diagnosis not present

## 2020-02-03 DIAGNOSIS — E785 Hyperlipidemia, unspecified: Secondary | ICD-10-CM | POA: Diagnosis not present

## 2020-02-03 DIAGNOSIS — D751 Secondary polycythemia: Secondary | ICD-10-CM | POA: Diagnosis not present

## 2020-02-04 ENCOUNTER — Encounter: Payer: Self-pay | Admitting: Neurology

## 2020-02-04 DIAGNOSIS — G4719 Other hypersomnia: Secondary | ICD-10-CM

## 2020-02-04 DIAGNOSIS — G478 Other sleep disorders: Secondary | ICD-10-CM

## 2020-02-04 DIAGNOSIS — F518 Other sleep disorders not due to a substance or known physiological condition: Secondary | ICD-10-CM

## 2020-02-11 ENCOUNTER — Ambulatory Visit: Payer: BC Managed Care – PPO | Admitting: Adult Health

## 2020-02-11 ENCOUNTER — Encounter: Payer: Self-pay | Admitting: Adult Health

## 2020-02-11 VITALS — BP 126/75 | HR 77 | Ht 69.0 in | Wt 245.0 lb

## 2020-02-11 DIAGNOSIS — Z9989 Dependence on other enabling machines and devices: Secondary | ICD-10-CM

## 2020-02-11 DIAGNOSIS — G4733 Obstructive sleep apnea (adult) (pediatric): Secondary | ICD-10-CM | POA: Diagnosis not present

## 2020-02-11 MED ORDER — FLUTICASONE PROPIONATE 50 MCG/ACT NA SUSP: 1.0000 | Freq: Every day | NASAL | 2 refills | Status: AC

## 2020-02-11 NOTE — Patient Instructions (Signed)
Continue using CPAP nightly and greater than 4 hours each night °If your symptoms worsen or you develop new symptoms please let us know.  ° °
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
## 2020-03-15 DIAGNOSIS — K529 Noninfective gastroenteritis and colitis, unspecified: Secondary | ICD-10-CM | POA: Diagnosis not present

## 2020-03-15 DIAGNOSIS — R790 Abnormal level of blood mineral: Secondary | ICD-10-CM | POA: Diagnosis not present

## 2020-03-15 DIAGNOSIS — K625 Hemorrhage of anus and rectum: Secondary | ICD-10-CM | POA: Diagnosis not present

## 2020-03-15 DIAGNOSIS — R198 Other specified symptoms and signs involving the digestive system and abdomen: Secondary | ICD-10-CM | POA: Diagnosis not present

## 2020-08-16 ENCOUNTER — Telehealth: Payer: BC Managed Care – PPO | Admitting: Adult Health

## 2020-08-16 NOTE — Progress Notes (Deleted)
PATIENT: Joshua Mccarthy DOB: 1970-12-07  REASON FOR VISIT: follow up HISTORY FROM: patient PRIMARY NEUROLOGIST:   Virtual Visit via Video Note  I connected with Joshua Mccarthy on 08/16/20 at  2:00 PM EDT by a video enabled telemedicine application located remotely at [facility] and verified that I am speaking with the correct person using two identifiers who was located at their own home.   I discussed the limitations of evaluation and management by telemedicine and the availability of in person appointments. The patient expressed understanding and agreed to proceed.   PATIENT: Joshua Mccarthy DOB: 1970-09-06  REASON FOR VISIT: follow up HISTORY FROM: patient  HISTORY OF PRESENT ILLNESS: Today 08/16/20:    HISTORY 02/11/20: Joshua Mccarthy is a 50 year old male with a history of obstructive sleep apnea on CPAP.  His download indicates that he uses machine nightly for compliance of 100%.  He uses machine greater than 4 hours 29 days for compliance of 97%.  On average he uses his machine 6 hours and 18 minutes.  His residual AHI is 3 on 9 to 20 cm of water with EPR of 2.  Leak in the 95th percentile is 5.6 L/min.  Reports that his pressure was increased yesterday to 11-20.  He has found this helpful but feels that the minimum pressure may need to be increased further.  He feels that CPAP has been beneficial.  He does state that he feels better during the day.  REVIEW OF SYSTEMS: Out of a complete 14 system review of symptoms, the patient complains only of the following symptoms, and all other reviewed systems are negative.  ALLERGIES: Allergies  Allergen Reactions   Penicillins Rash    HOME MEDICATIONS: Outpatient Medications Prior to Visit  Medication Sig Dispense Refill   fluticasone (FLONASE ALLERGY RELIEF) 50 MCG/ACT nasal spray Place 1 spray into both nostrils daily. 9.9 mL 2   testosterone cypionate (DEPOTESTOSTERONE CYPIONATE) 200 MG/ML injection Inject 200 mg  into the muscle once a week.     zolpidem (AMBIEN) 5 MG tablet Take 5 mg by mouth at bedtime as needed.     No facility-administered medications prior to visit.    PAST MEDICAL HISTORY: Past Medical History:  Diagnosis Date   Arthralgia    B12 deficiency 2019   Depression    Dyspnea    EBV infection 2013   Hypogonadism in male    Shingles 2013    PAST SURGICAL HISTORY: Past Surgical History:  Procedure Laterality Date   VASECTOMY  2009    FAMILY HISTORY: Family History  Problem Relation Age of Onset   CAD Brother    Diabetes Brother    Brain cancer Father    Heart disease Sister    Other Sister        atrophied muscular pain syndrome    SOCIAL HISTORY: Social History   Socioeconomic History   Marital status: Married    Spouse name: Not on file   Number of children: Not on file   Years of education: Not on file   Highest education level: Not on file  Occupational History   Not on file  Tobacco Use   Smoking status: Never   Smokeless tobacco: Never  Substance and Sexual Activity   Alcohol use: Not Currently   Drug use: Never   Sexual activity: Not on file  Other Topics Concern   Not on file  Social History Narrative   Not on file   Social Determinants of  Health   Financial Resource Strain: Not on file  Food Insecurity: Not on file  Transportation Needs: Not on file  Physical Activity: Not on file  Stress: Not on file  Social Connections: Not on file  Intimate Partner Violence: Not on file      PHYSICAL EXAM Generalized: Well developed, in no acute distress   Neurological examination  Mentation: Alert oriented to time, place, history taking. Follows all commands speech and language fluent Cranial nerve II-XII:Extraocular movements were full. Facial symmetry noted. uvula tongue midline. Head turning and shoulder shrug  were normal and symmetric. Motor: Good strength throughout subjectively per patient Sensory: Sensory testing is intact to soft  touch on all 4 extremities subjectively per patient Coordination: Cerebellar testing reveals good finger-nose-finger  Gait and station: Patient is able to stand from a seated position. gait is normal.  Reflexes: UTA  DIAGNOSTIC DATA (LABS, IMAGING, TESTING) - I reviewed patient records, labs, notes, testing and imaging myself where available.     ASSESSMENT AND PLAN 50 y.o. year old male  has a past medical history of Arthralgia, B12 deficiency (2019), Depression, Dyspnea, EBV infection (2013), Hypogonadism in male, and Shingles (2013). here with:  OSA on CPAP  CPAP compliance excellent Residual AHI is good Encouraged patient to continue using CPAP nightly and > 4 hours each night F/U in 1 year or sooner if needed  I spent 20 minutes of face-to-face and non-face-to-face time with patient.  This included previsit chart review, lab review, study review, order entry, electronic health record documentation, patient education.  Butch Penny, MSN, NP-C 08/16/2020, 1:52 PM [facility] [address], [address] [place], [place] [address] [phone]

## 2020-09-01 ENCOUNTER — Ambulatory Visit: Payer: BC Managed Care – PPO | Admitting: Adult Health

## 2020-09-01 VITALS — BP 132/82 | HR 80 | Ht 70.0 in | Wt 250.0 lb

## 2020-09-01 DIAGNOSIS — G4733 Obstructive sleep apnea (adult) (pediatric): Secondary | ICD-10-CM

## 2020-09-01 DIAGNOSIS — G4719 Other hypersomnia: Secondary | ICD-10-CM

## 2020-09-01 DIAGNOSIS — Z9989 Dependence on other enabling machines and devices: Secondary | ICD-10-CM

## 2020-09-01 NOTE — Patient Instructions (Signed)
  Your Plan:  Continue using CPAP nightly and greater than 4 hours each night Pressure increased Can consider adding on provigil for daytime sleepiness If your symptoms worsen or you develop new symptoms please let us know.       Thank you for coming to see [place] at [facility]. I hope we have been able to provide you high quality care today.  You may receive a patient satisfaction survey over the next few weeks. We would appreciate your feedback and comments so that we may continue to improve ourselves and the health of our patients.
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
## 2020-09-06 NOTE — Progress Notes (Signed)
Ross Ludwig, RN got it!   Thanks!       Previous Messages    ----- Message -----  From: Guy Begin, RN  Sent: 09/05/2020   5:10 PM EDT  To: [name]  Subject: cpap pressure change                           New cpap pressure change in Epic  thank you.  Joshua Mccarthy   Male, 50 y.o., Jan 25, 1971   MRN:  [id number]  [facility] RN

## 2020-11-07 ENCOUNTER — Ambulatory Visit: Payer: BC Managed Care – PPO | Admitting: Adult Health
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
## 2021-03-14 ENCOUNTER — Ambulatory Visit: Payer: BC Managed Care – PPO | Admitting: Adult Health

## 2021-03-14 ENCOUNTER — Encounter: Payer: Self-pay | Admitting: Adult Health

## 2021-03-14 VITALS — BP 136/76 | HR 89

## 2021-03-14 DIAGNOSIS — G4733 Obstructive sleep apnea (adult) (pediatric): Secondary | ICD-10-CM | POA: Diagnosis not present

## 2021-03-14 DIAGNOSIS — Z9989 Dependence on other enabling machines and devices: Secondary | ICD-10-CM | POA: Diagnosis not present

## 2021-03-14 DIAGNOSIS — G4719 Other hypersomnia: Secondary | ICD-10-CM | POA: Diagnosis not present

## 2022-03-12 ENCOUNTER — Telehealth: Payer: Self-pay | Admitting: Adult Health

## 2022-03-13 ENCOUNTER — Encounter: Payer: Self-pay | Admitting: *Deleted

## 2022-03-13 NOTE — Progress Notes (Addendum)
error 

## 2022-03-14 ENCOUNTER — Telehealth (INDEPENDENT_AMBULATORY_CARE_PROVIDER_SITE_OTHER): Payer: No Typology Code available for payment source | Admitting: Adult Health

## 2022-03-14 DIAGNOSIS — G4733 Obstructive sleep apnea (adult) (pediatric): Secondary | ICD-10-CM

## 2022-03-19 NOTE — Progress Notes (Signed)
PATIENT: Joshua Mccarthy DOB: November 29, 1970  REASON FOR VISIT: follow up HISTORY FROM: patient PRIMARY NEUROLOGIST:   Virtual Visit via Video Note  I connected with Joshua Mccarthy on 03/19/22 at  8:30 AM EST by a video enabled telemedicine application located remotely at [facility] and verified that I am speaking with the correct person using two identifiers who was located at their own home.   I discussed the limitations of evaluation and management by telemedicine and the availability of in person appointments. The patient expressed understanding and agreed to proceed.   PATIENT: Joshua Bacus DOB: 05-30-1970  REASON FOR VISIT: follow up HISTORY FROM: patient  HISTORY OF PRESENT ILLNESS: Today 03/19/22:  Joshua Mccarthy is a 52 y.o. male with a history of OSA on COPAO. Returns today for follow-up. Patient reports CPAP is working well reports that he still feels tired throughout the day.  Reports that he typically does not go to bed until 12 AM mainly because he tends to wake up frequently at night.  He states that he tries to go to bed earlier and he will wake up and be unable to go back to sleep.  His PCP started him on Vyvanse which has been helpful during the day.  He was also started on amitriptyline 50 mg and he reports that this made him too sleepy.  He has not tried reducing that dose.  His download is below       REVIEW OF SYSTEMS: Out of a complete 14 system review of symptoms, the patient complains only of the following symptoms, and all other reviewed systems are negative.  ALLERGIES: Allergies  Allergen Reactions   Penicillins Rash    HOME MEDICATIONS: Outpatient Medications Prior to Visit  Medication Sig Dispense Refill   amitriptyline (ELAVIL) 50 MG tablet Take 50 mg by mouth at bedtime.     lisdexamfetamine (VYVANSE) 40 MG capsule Take 40 mg by mouth every morning.     Melatonin 10 MG TABS Take 5 mg by mouth daily.     zolpidem (AMBIEN) 5 MG  tablet Take 5 mg by mouth at bedtime as needed.     fluticasone (FLONASE ALLERGY RELIEF) 50 MCG/ACT nasal spray Place 1 spray into both nostrils daily. 9.9 mL 2   testosterone cypionate (DEPOTESTOSTERONE CYPIONATE) 200 MG/ML injection Inject 200 mg into the muscle once a week.     No facility-administered medications prior to visit.    PAST MEDICAL HISTORY: Past Medical History:  Diagnosis Date   Arthralgia    B12 deficiency 2019   Depression    Dyspnea    EBV infection 2013   Hypogonadism in male    Shingles 2013    PAST SURGICAL HISTORY: Past Surgical History:  Procedure Laterality Date   VASECTOMY  2009    FAMILY HISTORY: Family History  Problem Relation Age of Onset   Brain cancer Father    Heart disease Sister    Other Sister        atrophied muscular pain syndrome   CAD Brother    Diabetes Brother    Sleep apnea Brother     SOCIAL HISTORY: Social History   Socioeconomic History   Marital status: Married    Spouse name: Not on file   Number of children: Not on file   Years of education: Not on file   Highest education level: Not on file  Occupational History   Not on file  Tobacco Use   Smoking status: Never  Smokeless tobacco: Never  Substance and Sexual Activity   Alcohol use: Not Currently   Drug use: Never   Sexual activity: Not on file  Other Topics Concern   Not on file  Social History Narrative   Not on file   Social Determinants of Health   Financial Resource Strain: Not on file  Food Insecurity: Not on file  Transportation Needs: Not on file  Physical Activity: Not on file  Stress: Not on file  Social Connections: Not on file  Intimate Partner Violence: Not on file      PHYSICAL EXAM Generalized: Well developed, in no acute distress   Neurological examination  Mentation: Alert oriented to time, place, history taking. Follows all commands speech and language fluent Cranial nerve II-XII:Extraocular movements were full. Facial  symmetry noted. uvula tongue midline. Head turning and shoulder shrug  were normal and symmetric.   DIAGNOSTIC DATA (LABS, IMAGING, TESTING) - I reviewed patient records, labs, notes, testing and imaging myself where available.   ASSESSMENT AND PLAN 52 y.o. year old male  has a past medical history of Arthralgia, B12 deficiency (2019), Depression, Dyspnea, EBV infection (2013), Hypogonadism in male, and Shingles (2013). here with:  OSA on CPAP  CPAP compliance excellent Residual AHI is good Encouraged patient to continue using CPAP nightly and > 4 hours each night Discussed sleep hygiene with the patient also advised that he could try reducing his dose of amitriptyline by cutting it in half.  Also advised that they make a 10 mg tablet.  He reports he will try taking lower dose this weekend.  Advised that when he takes amitriptyline he should avoid taking melatonin or Ambien. F/U in 6 months or sooner if needed    Ward Givens, MSN, NP-C 03/19/2022, 9:06 AM [facility] [address] [place], [place] [address] [phone]

## 2022-09-11 ENCOUNTER — Encounter: Payer: Self-pay | Admitting: *Deleted

## 2022-09-12 ENCOUNTER — Telehealth: Payer: Self-pay | Admitting: Adult Health

## 2022-09-12 DIAGNOSIS — G4733 Obstructive sleep apnea (adult) (pediatric): Secondary | ICD-10-CM | POA: Diagnosis not present

## 2022-09-12 DIAGNOSIS — G4719 Other hypersomnia: Secondary | ICD-10-CM

## 2022-09-12 NOTE — Patient Instructions (Signed)
Your Plan:  Continue CPAP therapy When you start to feel sleepy try to respond by getting ready for bed  Take amitriptyline 30 minutes before bedtime. If this is not beneficial can consider increasing amitriptyline to 15 or 20 mg If your symptoms worsen or you develop new symptoms please let us know.    Thank you for coming to see [place] at [facility]. I hope we have been able to provide you high quality care today.  You may receive a patient satisfaction survey over the next few weeks. We would appreciate your feedback and comments so that we may continue to improve ourselves and the health of our patients.

## 2023-03-26 ENCOUNTER — Telehealth: Payer: No Typology Code available for payment source | Admitting: Adult Health

## 2023-03-26 DIAGNOSIS — G4733 Obstructive sleep apnea (adult) (pediatric): Secondary | ICD-10-CM

## 2023-03-26 NOTE — Patient Instructions (Signed)
Continue using CPAP nightly and greater than 4 hours each night Could potentially do a trial on Provigil to see if it is helpful for daytime sleepiness If your symptoms worsen or you develop new symptoms please let us know.

## 2023-03-26 NOTE — Progress Notes (Signed)
PATIENT: Joshua Mccarthy DOB: March 07, 1970  REASON FOR VISIT: follow up HISTORY FROM: patient PRIMARY NEUROLOGIST: Dr. Vickey Huger  Virtual Visit via Video Note  I connected with Joshua Mccarthy on 03/26/23 at  3:15 PM EST by a video enabled telemedicine application located remotely at [facility] and verified that I am speaking with the correct person using two identifiers who was located at their own home.   I discussed the limitations of evaluation and management by telemedicine and the availability of in person appointments. The patient expressed understanding and agreed to proceed.   PATIENT: Joshua Mccarthy DOB: 1970/06/23  REASON FOR VISIT: follow up HISTORY FROM: patient  HISTORY OF PRESENT ILLNESS: Today 03/26/23:  Joshua Mccarthy is a 53 y.o. male with a history of OSA on CPAP. Returns today for follow-up. Reports that he still feels exhausted when he gets home from work. Was on vyvanse but it caused his heart to race. Reports that he saw Dr. Evlyn Kanner and his vit D was low. He is  taking supplement.  Returns today for an evaluation.       Joshua Mccarthy is a 53 y.o. male with a history of OSA on COPAO. Returns today for follow-up. Patient reports CPAP is working well reports that he still feels tired throughout the day.  Reports that he typically does not go to bed until 12 AM mainly because he tends to wake up frequently at night.  He states that he tries to go to bed earlier and he will wake up and be unable to go back to sleep.  His PCP started him on Vyvanse which has been helpful during the day.  He was also started on amitriptyline 50 mg and he reports that this made him too sleepy.  He has not tried reducing that dose.  His download is below       REVIEW OF SYSTEMS: Out of a complete 14 system review of symptoms, the patient complains only of the following symptoms, and all other reviewed systems are negative.  ALLERGIES: Allergies  Allergen Reactions    Penicillins Rash    HOME MEDICATIONS: Outpatient Medications Prior to Visit  Medication Sig Dispense Refill   amitriptyline (ELAVIL) 50 MG tablet Take 50 mg by mouth at bedtime.     fluticasone (FLONASE ALLERGY RELIEF) 50 MCG/ACT nasal spray Place 1 spray into both nostrils daily. 9.9 mL 2   lisdexamfetamine (VYVANSE) 40 MG capsule Take 40 mg by mouth every morning.     Melatonin 10 MG TABS Take 5 mg by mouth daily.     testosterone cypionate (DEPOTESTOSTERONE CYPIONATE) 200 MG/ML injection Inject 200 mg into the muscle once a week.     zolpidem (AMBIEN) 5 MG tablet Take 5 mg by mouth at bedtime as needed.     No facility-administered medications prior to visit.    PAST MEDICAL HISTORY: Past Medical History:  Diagnosis Date   Arthralgia    B12 deficiency 2019   Depression    Dyspnea    EBV infection 2013   Hypogonadism in male    Shingles 2013    PAST SURGICAL HISTORY: Past Surgical History:  Procedure Laterality Date   VASECTOMY  2009    FAMILY HISTORY: Family History  Problem Relation Age of Onset   Brain cancer Father    Heart disease Sister    Other Sister        atrophied muscular pain syndrome   CAD Brother    Diabetes Brother  Sleep apnea Brother     SOCIAL HISTORY: Social History   Socioeconomic History   Marital status: Married    Spouse name: Not on file   Number of children: Not on file   Years of education: Not on file   Highest education level: Not on file  Occupational History   Not on file  Tobacco Use   Smoking status: Never   Smokeless tobacco: Never  Substance and Sexual Activity   Alcohol use: Not Currently   Drug use: Never   Sexual activity: Not on file  Other Topics Concern   Not on file  Social History Narrative   Not on file   Social Drivers of Health   Financial Resource Strain: Not on file  Food Insecurity: Not on file  Transportation Needs: Not on file  Physical Activity: Not on file  Stress: Not on file  Social  Connections: Not on file  Intimate Partner Violence: Not on file      PHYSICAL EXAM Generalized: Well developed, in no acute distress   Neurological examination  Mentation: Alert oriented to time, place, history taking. Follows all commands speech and language fluent Cranial nerve II-XII:Extraocular movements were full. Facial symmetry noted.    DIAGNOSTIC DATA (LABS, IMAGING, TESTING) - I reviewed patient records, labs, notes, testing and imaging myself where available.   ASSESSMENT AND PLAN 53 y.o. year old male  has a past medical history of Arthralgia, B12 deficiency (2019), Depression, Dyspnea, EBV infection (2013), Hypogonadism in male, and Shingles (2013). here with:  OSA on CPAP  CPAP compliance excellent Residual AHI is good Encouraged patient to continue using CPAP nightly and > 4 hours each night We discussed potentially trying Provigil to help him with fatigue and sleepiness later in the afternoon. F/U in 6 months with Dr. Vickey Huger or sooner if needed    Butch Penny, MSN, NP-C 03/26/2023, 3:22 PM [facility] [address] [place], [place] [address] [phone]

## 2023-09-03 DIAGNOSIS — E039 Hypothyroidism, unspecified: Secondary | ICD-10-CM | POA: Diagnosis not present

## 2023-09-03 DIAGNOSIS — E785 Hyperlipidemia, unspecified: Secondary | ICD-10-CM | POA: Diagnosis not present

## 2023-09-03 DIAGNOSIS — E291 Testicular hypofunction: Secondary | ICD-10-CM | POA: Diagnosis not present

## 2023-09-09 DIAGNOSIS — E230 Hypopituitarism: Secondary | ICD-10-CM | POA: Diagnosis not present

## 2023-09-23 ENCOUNTER — Ambulatory Visit: Payer: No Typology Code available for payment source | Admitting: Neurology

## 2023-09-23 ENCOUNTER — Telehealth: Payer: Self-pay | Admitting: Neurology

## 2023-09-23 ENCOUNTER — Encounter: Payer: Self-pay | Admitting: Neurology

## 2023-09-23 VITALS — BP 138/80 | HR 96 | Ht 69.0 in | Wt 258.6 lb

## 2023-09-23 DIAGNOSIS — F5105 Insomnia due to other mental disorder: Secondary | ICD-10-CM | POA: Diagnosis not present

## 2023-09-23 DIAGNOSIS — G4733 Obstructive sleep apnea (adult) (pediatric): Secondary | ICD-10-CM | POA: Insufficient documentation

## 2023-09-23 DIAGNOSIS — G4719 Other hypersomnia: Secondary | ICD-10-CM

## 2023-09-23 MED ORDER — TEMAZEPAM 15 MG PO CAPS: 15.0000 mg | ORAL_CAPSULE | Freq: Every evening | ORAL | 0 refills | Status: AC | PRN

## 2023-09-23 MED ORDER — AMITRIPTYLINE HCL 25 MG PO TABS: 25.0000 mg | ORAL_TABLET | Freq: Every day | ORAL | 3 refills | Status: AC

## 2023-09-23 MED ORDER — BELSOMRA 15 MG PO TABS: 15.0000 mg | ORAL_TABLET | Freq: Every evening | ORAL | 2 refills | Status: AC | PRN

## 2023-09-23 NOTE — Progress Notes (Signed)
 Joshua Mccarthy

## 2023-09-23 NOTE — Telephone Encounter (Signed)
 [facility] called to request callback . Pharmacy states 2 medication was sent to  pharamcy ,however one of the medication they would have to order Pharmacy need answer as of today    temazepam  (RESTORIL ) 15 MG capsule and   amitriptyline  (ELAVIL ) 25 MG tablet   They received  Order, Pharmacy just wanted to follow up before filling the PT Medication
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
## 2023-09-24 NOTE — Telephone Encounter (Signed)
 Attempted to call pharmacy. Not open will call after 9am.

## 2023-09-24 NOTE — Telephone Encounter (Signed)
 Spoke w/pharmacist at [facility]. They are questioning if provider wants Pt to have both Belsomra  and Restoril  as they have not filled Belsomra  for Pt in the past. Informed will inquire and get back with them. Pharmacist voiced thanks for the call back.

## 2023-09-25 NOTE — Telephone Encounter (Signed)
 [facility] to clarify the reason for Belsomra  and Restoril  script. Spoke w/pharmacist and explained provider wants Pt to try Belsomra  but if he was unable to get the medication she prescribed Restoril  so he would have some medication to take. Pharmacist stated Pt came by yesterday to pick up meds but Belsomra  was too expensive and Pt did not want it so he picked up the Restoril .

## 2023-10-22 DIAGNOSIS — G4733 Obstructive sleep apnea (adult) (pediatric): Secondary | ICD-10-CM | POA: Diagnosis not present

## 2023-10-29 ENCOUNTER — Other Ambulatory Visit: Payer: Self-pay | Admitting: Neurology

## 2023-11-09 ENCOUNTER — Other Ambulatory Visit: Payer: Self-pay | Admitting: Neurology

## 2023-12-22 DIAGNOSIS — G4733 Obstructive sleep apnea (adult) (pediatric): Secondary | ICD-10-CM | POA: Diagnosis not present

## 2023-12-30 DIAGNOSIS — Z79899 Other long term (current) drug therapy: Secondary | ICD-10-CM | POA: Diagnosis not present

## 2024-01-02 DIAGNOSIS — M79605 Pain in left leg: Secondary | ICD-10-CM | POA: Diagnosis not present

## 2024-01-02 DIAGNOSIS — M2569 Stiffness of other specified joint, not elsewhere classified: Secondary | ICD-10-CM | POA: Diagnosis not present

## 2024-01-02 DIAGNOSIS — M79604 Pain in right leg: Secondary | ICD-10-CM | POA: Diagnosis not present

## 2024-01-02 DIAGNOSIS — R2689 Other abnormalities of gait and mobility: Secondary | ICD-10-CM | POA: Diagnosis not present

## 2024-01-10 DIAGNOSIS — M2569 Stiffness of other specified joint, not elsewhere classified: Secondary | ICD-10-CM | POA: Diagnosis not present

## 2024-01-10 DIAGNOSIS — M79605 Pain in left leg: Secondary | ICD-10-CM | POA: Diagnosis not present

## 2024-01-10 DIAGNOSIS — R2689 Other abnormalities of gait and mobility: Secondary | ICD-10-CM | POA: Diagnosis not present

## 2024-01-10 DIAGNOSIS — M79604 Pain in right leg: Secondary | ICD-10-CM | POA: Diagnosis not present

## 2024-01-17 DIAGNOSIS — R2689 Other abnormalities of gait and mobility: Secondary | ICD-10-CM | POA: Diagnosis not present

## 2024-01-17 DIAGNOSIS — M79604 Pain in right leg: Secondary | ICD-10-CM | POA: Diagnosis not present

## 2024-01-17 DIAGNOSIS — M2569 Stiffness of other specified joint, not elsewhere classified: Secondary | ICD-10-CM | POA: Diagnosis not present

## 2024-01-17 DIAGNOSIS — M79605 Pain in left leg: Secondary | ICD-10-CM | POA: Diagnosis not present

## 2024-01-22 DIAGNOSIS — M2569 Stiffness of other specified joint, not elsewhere classified: Secondary | ICD-10-CM | POA: Diagnosis not present

## 2024-01-22 DIAGNOSIS — M79604 Pain in right leg: Secondary | ICD-10-CM | POA: Diagnosis not present

## 2024-01-22 DIAGNOSIS — R2689 Other abnormalities of gait and mobility: Secondary | ICD-10-CM | POA: Diagnosis not present

## 2024-01-22 DIAGNOSIS — M79605 Pain in left leg: Secondary | ICD-10-CM | POA: Diagnosis not present

## 2024-01-31 DIAGNOSIS — M79604 Pain in right leg: Secondary | ICD-10-CM | POA: Diagnosis not present

## 2024-01-31 DIAGNOSIS — M2569 Stiffness of other specified joint, not elsewhere classified: Secondary | ICD-10-CM | POA: Diagnosis not present

## 2024-01-31 DIAGNOSIS — R2689 Other abnormalities of gait and mobility: Secondary | ICD-10-CM | POA: Diagnosis not present

## 2024-01-31 DIAGNOSIS — M79605 Pain in left leg: Secondary | ICD-10-CM | POA: Diagnosis not present

## 2024-02-07 DIAGNOSIS — M79604 Pain in right leg: Secondary | ICD-10-CM | POA: Diagnosis not present

## 2024-02-07 DIAGNOSIS — R2689 Other abnormalities of gait and mobility: Secondary | ICD-10-CM | POA: Diagnosis not present

## 2024-02-07 DIAGNOSIS — M79605 Pain in left leg: Secondary | ICD-10-CM | POA: Diagnosis not present

## 2024-02-07 DIAGNOSIS — M2569 Stiffness of other specified joint, not elsewhere classified: Secondary | ICD-10-CM | POA: Diagnosis not present

## 2024-02-10 DIAGNOSIS — R2689 Other abnormalities of gait and mobility: Secondary | ICD-10-CM | POA: Diagnosis not present

## 2024-02-10 DIAGNOSIS — M79605 Pain in left leg: Secondary | ICD-10-CM | POA: Diagnosis not present

## 2024-02-10 DIAGNOSIS — M79604 Pain in right leg: Secondary | ICD-10-CM | POA: Diagnosis not present

## 2024-02-10 DIAGNOSIS — M2569 Stiffness of other specified joint, not elsewhere classified: Secondary | ICD-10-CM | POA: Diagnosis not present

## 2024-03-30 ENCOUNTER — Ambulatory Visit: Admitting: Neurology

## 2024-09-24 ENCOUNTER — Ambulatory Visit: Admitting: Neurology
